# Patient Record
Sex: Male | Born: 1955 | Race: White | Hispanic: No | Smoking: Current every day smoker
Health system: Southern US, Community
[De-identification: ages and names within clinical notes are randomized; demographics above are authoritative.]

## PROBLEM LIST (undated history)

## (undated) DIAGNOSIS — F419 Anxiety disorder, unspecified: Secondary | ICD-10-CM

## (undated) DIAGNOSIS — K219 Gastro-esophageal reflux disease without esophagitis: Secondary | ICD-10-CM

## (undated) DIAGNOSIS — F1010 Alcohol abuse, uncomplicated: Secondary | ICD-10-CM

## (undated) DIAGNOSIS — E079 Disorder of thyroid, unspecified: Secondary | ICD-10-CM

## (undated) DIAGNOSIS — E7800 Pure hypercholesterolemia, unspecified: Secondary | ICD-10-CM

## (undated) DIAGNOSIS — M549 Dorsalgia, unspecified: Secondary | ICD-10-CM

## (undated) HISTORY — PX: CARPAL TUNNEL RELEASE: SHX101

## (undated) HISTORY — PX: CATARACT EXTRACTION: SUR2

---

## 2000-06-16 ENCOUNTER — Encounter: Payer: Self-pay | Admitting: Gastroenterology

## 2000-06-16 ENCOUNTER — Encounter: Admission: RE | Admit: 2000-06-16 | Discharge: 2000-06-16 | Payer: Self-pay | Admitting: Gastroenterology

## 2001-06-02 ENCOUNTER — Encounter: Admission: RE | Admit: 2001-06-02 | Discharge: 2001-06-02 | Payer: Self-pay | Admitting: Gastroenterology

## 2001-06-02 ENCOUNTER — Encounter: Payer: Self-pay | Admitting: Gastroenterology

## 2005-03-19 ENCOUNTER — Ambulatory Visit (HOSPITAL_COMMUNITY): Admission: RE | Admit: 2005-03-19 | Discharge: 2005-03-19 | Payer: Self-pay | Admitting: Neurosurgery

## 2005-03-31 ENCOUNTER — Ambulatory Visit (HOSPITAL_COMMUNITY): Admission: RE | Admit: 2005-03-31 | Discharge: 2005-03-31 | Payer: Self-pay | Admitting: Neurosurgery

## 2005-11-29 ENCOUNTER — Encounter: Admission: RE | Admit: 2005-11-29 | Discharge: 2005-11-29 | Payer: Self-pay | Admitting: Gastroenterology

## 2006-12-06 ENCOUNTER — Encounter: Admission: RE | Admit: 2006-12-06 | Discharge: 2006-12-06 | Payer: Self-pay | Admitting: Gastroenterology

## 2007-01-13 ENCOUNTER — Encounter: Admission: RE | Admit: 2007-01-13 | Discharge: 2007-01-13 | Payer: Self-pay | Admitting: Gastroenterology

## 2007-11-26 IMAGING — NM NM HEPATO W/GB/PHARM/[PERSON_NAME]
1 series · 6 of 6 positions shown · non-contrast
Comparison: none

CLINICAL DATA: Right sided abdominal pain. 
 NUCLEAR MEDICINE HEPATOBILIARY SCAN WITH EJECTION FRACTION:
TECHNIQUE: Sequential abdominal images were obtained following intravenous injection of radiopharmaceutical.  Sequential images were continued following oral ingestion of 8 oz. half-and-half cream, and the gallbladder ejection fraction was calculated.
 Radiopharmaceutical:  5.0 mCi Tc-MMm Choletec

[gb hepatobiliary · 4.66mm/px · 6 of 12 frames shown]
[frame 2/12]
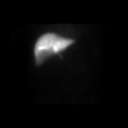
[frame 4/12]
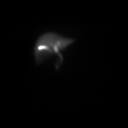
[frame 6/12]
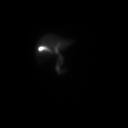
[frame 8/12]
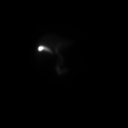
[frame 10/12]
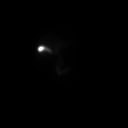
[frame 12/12]
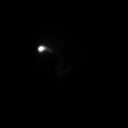

[6 of 6 positions shown; findings below may reference images not displayed]

FINDINGS: The initial images demonstrate homogeneous hepatic activity with prompt opacification of the gallbladder and biliary system.  There is spontaneous drainage into the small bowel.  
 The stimulated portion of the study demonstrates good gallbladder contraction.  The gallbladder ejection fraction calculated at 25 minutes is 94%, well within normal limits.
IMPRESSION: Normal examination.

## 2017-01-26 ENCOUNTER — Emergency Department (HOSPITAL_BASED_OUTPATIENT_CLINIC_OR_DEPARTMENT_OTHER)
Admission: EM | Admit: 2017-01-26 | Discharge: 2017-01-26 | Disposition: A | Discharge status: Home / Self Care | Payer: Worker's Compensation | Attending: Emergency Medicine | Admitting: Emergency Medicine

## 2017-01-26 ENCOUNTER — Encounter (HOSPITAL_BASED_OUTPATIENT_CLINIC_OR_DEPARTMENT_OTHER): Payer: Self-pay

## 2017-01-26 DIAGNOSIS — Y939 Activity, unspecified: Secondary | ICD-10-CM | POA: Diagnosis not present

## 2017-01-26 DIAGNOSIS — W25XXXA Contact with sharp glass, initial encounter: Secondary | ICD-10-CM | POA: Diagnosis not present

## 2017-01-26 DIAGNOSIS — Y929 Unspecified place or not applicable: Secondary | ICD-10-CM | POA: Diagnosis not present

## 2017-01-26 DIAGNOSIS — S61412A Laceration without foreign body of left hand, initial encounter: Secondary | ICD-10-CM | POA: Insufficient documentation

## 2017-01-26 DIAGNOSIS — Y990 Civilian activity done for income or pay: Secondary | ICD-10-CM | POA: Diagnosis not present

## 2017-01-26 DIAGNOSIS — F17200 Nicotine dependence, unspecified, uncomplicated: Secondary | ICD-10-CM | POA: Diagnosis not present

## 2017-01-26 HISTORY — DX: Pure hypercholesterolemia, unspecified: E78.00

## 2017-01-26 HISTORY — DX: Disorder of thyroid, unspecified: E07.9

## 2017-01-26 HISTORY — DX: Gastro-esophageal reflux disease without esophagitis: K21.9

## 2017-01-26 HISTORY — DX: Anxiety disorder, unspecified: F41.9

## 2017-01-26 HISTORY — DX: Alcohol abuse, uncomplicated: F10.10

## 2017-01-26 HISTORY — DX: Dorsalgia, unspecified: M54.9

## 2017-01-26 MED ORDER — LIDOCAINE-EPINEPHRINE 2 %-1:100000 IJ SOLN
20.0000 mL | Freq: Once | INTRAMUSCULAR | Status: AC
  Administered 2017-01-26: 1 mL
  Filled 2017-01-26: qty 1

## 2017-01-26 NOTE — ED Triage Notes (Signed)
Pt states he cut right hand on glass this am at work-lac noted to back of head-bleeding controlled-NAD-steady gait

## 2017-01-26 NOTE — ED Notes (Signed)
Injury site cleaned, Bacitracin Oint applied per ED orders, dsg applied. Pt teaching done regards to application of Bacitracin and how to apply to dsg. Also instructed to keep hand cleaned, covered while at work, Discussed signs and symptoms of infection

## 2017-01-26 NOTE — ED Provider Notes (Signed)
[facility] Provider Note   CSN: [id number] Arrival date & time: 01/26/17  1134     History   Chief Complaint Chief Complaint  Patient presents with  . Hand Injury    HPI Samuel Stout is a 61 y.o. male.  61 yo M with a chief complaint of a laceration. Happened while at work. Had a piece of glass go through his hand. Avulsion of the skin. Bleeding controlled. Tetanus up-to-date.   The history is provided by the patient.  Hand Injury   The incident occurred 1 to 2 hours ago. The incident occurred at work. Injury mechanism: through broken glass. The pain is present in the left fingers. The quality of the pain is described as burning. The pain is at a severity of 4/10. The pain is mild. The pain has been constant since the incident. Pertinent negatives include no fever. He reports no foreign bodies present. The symptoms are aggravated by movement and palpation. He has tried nothing for the symptoms. The treatment provided no relief.    Past Medical History:  Diagnosis Date  . Anxiety   . Back pain   . ETOH abuse   . GERD (gastroesophageal reflux disease)   . High cholesterol   . Thyroid disease     There are no active problems to display for this patient.   Past Surgical History:  Procedure Laterality Date  . CARPAL TUNNEL RELEASE    . CATARACT EXTRACTION         Home Medications    Prior to Admission medications   Not on File    Family History No family history on file.  Social History Social History  Substance Use Topics  . Smoking status: Current Every Day Smoker  . Smokeless tobacco: Never Used  . Alcohol use No     Allergies   Codeine   Review of Systems Review of Systems  Constitutional: Negative for chills and fever.  HENT: Negative for congestion and facial swelling.   Eyes: Negative for discharge and visual disturbance.  Respiratory: Negative for shortness of breath.   Cardiovascular: Negative for chest pain and  palpitations.  Gastrointestinal: Negative for abdominal pain, diarrhea and vomiting.  Musculoskeletal: Negative for arthralgias and myalgias.  Skin: Positive for wound. Negative for color change and rash.  Neurological: Negative for tremors, syncope and headaches.  Psychiatric/Behavioral: Negative for confusion and dysphoric mood.     Physical Exam Updated Vital Signs BP 136/70 (BP Location: Right Arm)   Pulse 77   Temp 99.1 F (37.3 C) (Oral)   Resp 18   Ht 5\' 8"  (1.727 m)   Wt 200 lb (90.7 kg)   SpO2 98%   BMI 30.41 kg/m   Physical Exam  Constitutional: He is oriented to person, place, and time. He appears well-developed and well-nourished.  HENT:  Head: Normocephalic and atraumatic.  Eyes: EOM are normal. Pupils are equal, round, and reactive to light.  Neck: Normal range of motion. Neck supple. No JVD present.  Cardiovascular: Normal rate and regular rhythm.  Exam reveals no gallop and no friction rub.   No murmur heard. Pulmonary/Chest: No respiratory distress. He has no wheezes.  Abdominal: He exhibits no distension. There is no rebound and no guarding.  Musculoskeletal: Normal range of motion.       Hands: Neurological: He is alert and oriented to person, place, and time.  Skin: No rash noted. No pallor.  Psychiatric: He has a normal mood and affect. His behavior  is normal.  Nursing note and vitals reviewed.    ED Treatments / Results  Labs (all labs ordered are listed, but only abnormal results are displayed) Labs Reviewed - No data to display  EKG  EKG Interpretation None       Radiology No results found.  Procedures Procedures (including critical care time)  Medications Ordered in ED Medications  lidocaine-EPINEPHrine (XYLOCAINE W/EPI) 2 %-1:100000 (with pres) injection 20 mL (1 mL Infiltration Given 01/26/17 1322)     Initial Impression / Assessment and Plan / ED Course  I have reviewed the triage vital signs and the nursing  notes.  Pertinent labs & imaging results that were available during my care of the patient were reviewed by me and considered in my medical decision making (see chart for details).     61 yo M with a superficial avulsion to his left hand. Unable to repair as skin is missing. No foreign bodies. Discharge home.  1:37 PM:  I have discussed the diagnosis/risks/treatment options with the patient and believe the pt to be eligible for discharge home to follow-up with PCP. We also discussed returning to the ED immediately if new or worsening sx occur. We discussed the sx which are most concerning (e.g., sudden worsening pain, fever, inability to tolerate by mouth) that necessitate immediate return. Medications administered to the patient during their visit and any new prescriptions provided to the patient are listed below.  Medications given during this visit Medications  lidocaine-EPINEPHrine (XYLOCAINE W/EPI) 2 %-1:100000 (with pres) injection 20 mL (1 mL Infiltration Given 01/26/17 1322)     The patient appears reasonably screen and/or stabilized for discharge and I doubt any other medical condition or other [facility] requiring further screening, evaluation, or treatment in the ED at this time prior to discharge.    Final Clinical Impressions(s) / ED Diagnoses   Final diagnoses:  Superficial laceration of left hand, initial encounter    New Prescriptions New Prescriptions   No medications on file     Melene Plan, DO 01/26/17 1337

## 2017-01-26 NOTE — ED Notes (Signed)
Irregular laceration noted on left hand near 5th digit, no active bleeding noted. Able to move all digits w/o difficulty, denies any tingling, numbness of digits, capillary refill WNL, clean gauze applied to cover site until evaluated by MD

## 2017-01-26 NOTE — ED Notes (Signed)
PT STATES HIS LA[facility]

## 2017-01-26 NOTE — ED Notes (Signed)
DC instructions reviewed with pt and copy of EDP instructions provided to pt

## 2017-01-26 NOTE — ED Notes (Signed)
Pt presents with laceration/ injury to left hand near 5th digit, pt received injury from windshield of vehicle, states placed antiseptic spray on area prior to arrival
